# Patient Record
Sex: Female | Born: 1995 | Race: Black or African American | Hispanic: No | Marital: Single | Smoking: Never smoker
Health system: Southern US, Community
[De-identification: ages and names within clinical notes are randomized; demographics above are authoritative.]

## PROBLEM LIST (undated history)

## (undated) DIAGNOSIS — T148XXA Other injury of unspecified body region, initial encounter: Secondary | ICD-10-CM

## (undated) DIAGNOSIS — D6861 Antiphospholipid syndrome: Secondary | ICD-10-CM

## (undated) DIAGNOSIS — IMO0002 Reserved for concepts with insufficient information to code with codable children: Secondary | ICD-10-CM

## (undated) DIAGNOSIS — M329 Systemic lupus erythematosus, unspecified: Secondary | ICD-10-CM

## (undated) DIAGNOSIS — N289 Disorder of kidney and ureter, unspecified: Secondary | ICD-10-CM

## (undated) DIAGNOSIS — D735 Infarction of spleen: Secondary | ICD-10-CM

## (undated) DIAGNOSIS — Q2730 Arteriovenous malformation, site unspecified: Secondary | ICD-10-CM

---

## 2014-11-28 ENCOUNTER — Encounter (HOSPITAL_COMMUNITY): Payer: Self-pay

## 2014-11-28 ENCOUNTER — Emergency Department (HOSPITAL_COMMUNITY): Payer: Medicaid Other

## 2014-11-28 ENCOUNTER — Emergency Department (HOSPITAL_COMMUNITY)
Admission: EM | Admit: 2014-11-28 | Discharge: 2014-11-28 | Disposition: A | Discharge status: Home / Self Care | Payer: Medicaid Other | Attending: Emergency Medicine | Admitting: Emergency Medicine

## 2014-11-28 DIAGNOSIS — Z793 Long term (current) use of hormonal contraceptives: Secondary | ICD-10-CM | POA: Insufficient documentation

## 2014-11-28 DIAGNOSIS — Z3202 Encounter for pregnancy test, result negative: Secondary | ICD-10-CM | POA: Diagnosis not present

## 2014-11-28 DIAGNOSIS — N83201 Unspecified ovarian cyst, right side: Secondary | ICD-10-CM | POA: Insufficient documentation

## 2014-11-28 DIAGNOSIS — Z880 Allergy status to penicillin: Secondary | ICD-10-CM | POA: Diagnosis not present

## 2014-11-28 DIAGNOSIS — Z79899 Other long term (current) drug therapy: Secondary | ICD-10-CM | POA: Insufficient documentation

## 2014-11-28 DIAGNOSIS — R102 Pelvic and perineal pain: Secondary | ICD-10-CM

## 2014-11-28 DIAGNOSIS — N939 Abnormal uterine and vaginal bleeding, unspecified: Secondary | ICD-10-CM | POA: Diagnosis present

## 2014-11-28 DIAGNOSIS — N83209 Unspecified ovarian cyst, unspecified side: Secondary | ICD-10-CM

## 2014-11-28 LAB — URINALYSIS, ROUTINE W REFLEX MICROSCOPIC
Bilirubin Urine: NEGATIVE
Glucose, UA: NEGATIVE mg/dL
Ketones, ur: NEGATIVE mg/dL
Nitrite: NEGATIVE
Protein, ur: 100 mg/dL — AB
Specific Gravity, Urine: 1.018 (ref 1.005–1.030)
Urobilinogen, UA: 1 mg/dL (ref 0.0–1.0)
pH: 6 (ref 5.0–8.0)

## 2014-11-28 LAB — BASIC METABOLIC PANEL WITH GFR
Anion gap: 10 (ref 5–15)
BUN: 9 mg/dL (ref 6–20)
CO2: 26 mmol/L (ref 22–32)
Calcium: 9.4 mg/dL (ref 8.9–10.3)
Chloride: 103 mmol/L (ref 101–111)
Creatinine, Ser: 0.79 mg/dL (ref 0.44–1.00)
GFR calc Af Amer: >60 mL/min
GFR calc non Af Amer: >60 mL/min
Glucose, Bld: 88 mg/dL (ref 65–99)
Potassium: 3.5 mmol/L (ref 3.5–5.1)
Sodium: 139 mmol/L (ref 135–145)

## 2014-11-28 LAB — WET PREP, GENITAL
Trich, Wet Prep: NONE SEEN
Yeast Wet Prep HPF POC: NONE SEEN

## 2014-11-28 LAB — CBC WITH DIFFERENTIAL/PLATELET
Basophils Absolute: 0 K/uL (ref 0.0–0.1)
Basophils Relative: 1 %
Eosinophils Absolute: 0 K/uL (ref 0.0–0.7)
Eosinophils Relative: 1 %
HCT: 36.8 % (ref 36.0–46.0)
Hemoglobin: 12 g/dL (ref 12.0–15.0)
Lymphocytes Relative: 35 %
Lymphs Abs: 2.2 K/uL (ref 0.7–4.0)
MCH: 26.8 pg (ref 26.0–34.0)
MCHC: 32.6 g/dL (ref 30.0–36.0)
MCV: 82.3 fL (ref 78.0–100.0)
Monocytes Absolute: 0.5 K/uL (ref 0.1–1.0)
Monocytes Relative: 8 %
Neutro Abs: 3.6 K/uL (ref 1.7–7.7)
Neutrophils Relative %: 57 %
Platelets: 481 K/uL — ABNORMAL HIGH (ref 150–400)
RBC: 4.47 MIL/uL (ref 3.87–5.11)
RDW: 14.1 % (ref 11.5–15.5)
WBC: 6.4 K/uL (ref 4.0–10.5)

## 2014-11-28 LAB — I-STAT BETA HCG BLOOD, ED (MC, WL, AP ONLY): I-stat hCG, quantitative: <5 m[IU]/mL

## 2014-11-28 LAB — URINE MICROSCOPIC-ADD ON

## 2014-11-28 LAB — POC URINE PREG, ED: PREG TEST UR: NEGATIVE

## 2014-11-28 MED ORDER — OXYCODONE-ACETAMINOPHEN 5-325 MG PO TABS
1.0000 | ORAL_TABLET | Freq: Once | ORAL | Status: AC
  Administered 2014-11-28: 1 via ORAL
  Filled 2014-11-28: qty 1

## 2014-11-28 NOTE — ED Notes (Signed)
Pelvic cart at bedside. 

## 2014-11-28 NOTE — Discharge Instructions (Signed)
Pelvic Pain, Female °Female pelvic pain can be caused by many different things and start from a variety of places. Pelvic pain refers to pain that is located in the lower half of the abdomen and between your hips. The pain may occur over a short period of time (acute) or may be reoccurring (chronic). The cause of pelvic pain may be related to disorders affecting the female reproductive organs (gynecologic), but it may also be related to the bladder, kidney stones, an intestinal complication, or muscle or skeletal problems. Getting help right away for pelvic pain is important, especially if there has been severe, sharp, or a sudden onset of unusual pain. It is also important to get help right away because some types of pelvic pain can be life threatening.  °CAUSES  °Below are only some of the causes of pelvic pain. The causes of pelvic pain can be in one of several categories.  °· Gynecologic. °¨ Pelvic inflammatory disease. °¨ Sexually transmitted infection. °¨ Ovarian cyst or a twisted ovarian ligament (ovarian torsion). °¨ Uterine lining that grows outside the uterus (endometriosis). °¨ Fibroids, cysts, or tumors. °¨ Ovulation. °· Pregnancy. °¨ Pregnancy that occurs outside the uterus (ectopic pregnancy). °¨ Miscarriage. °¨ Labor. °¨ Abruption of the placenta or ruptured uterus. °· Infection. °¨ Uterine infection (endometritis). °¨ Bladder infection. °¨ Diverticulitis. °¨ Miscarriage related to a uterine infection (septic abortion). °· Bladder. °¨ Inflammation of the bladder (cystitis). °¨ Kidney stone(s). °· Gastrointestinal. °¨ Constipation. °¨ Diverticulitis. °· Neurologic. °¨ Trauma. °¨ Feeling pelvic pain because of mental or emotional causes (psychosomatic). °· Cancers of the bowel or pelvis. °EVALUATION  °Your caregiver will want to take a careful history of your concerns. This includes recent changes in your health, a careful gynecologic history of your periods (menses), and a sexual history. Obtaining  your family history and medical history is also important. Your caregiver may suggest a pelvic exam. A pelvic exam will help identify the location and severity of the pain. It also helps in the evaluation of which organ system may be involved. In order to identify the cause of the pelvic pain and be properly treated, your caregiver may order tests. These tests may include:  °· A pregnancy test. °· Pelvic ultrasonography. °· An X-ray exam of the abdomen. °· A urinalysis or evaluation of vaginal discharge. °· Blood tests. °HOME CARE INSTRUCTIONS  °· Only take over-the-counter or prescription medicines for pain, discomfort, or fever as directed by your caregiver.   °· Rest as directed by your caregiver.   °· Eat a balanced diet.   °· Drink enough fluids to make your urine clear or pale yellow, or as directed.   °· Avoid sexual intercourse if it causes pain.   °· Apply warm or cold compresses to the lower abdomen depending on which one helps the pain.   °· Avoid stressful situations.   °· Keep a journal of your pelvic pain. Write down when it started, where the pain is located, and if there are things that seem to be associated with the pain, such as food or your menstrual cycle. °· Follow up with your caregiver as directed.   °SEEK MEDICAL CARE IF: °· Your medicine does not help your pain. °· You have abnormal vaginal discharge. °SEEK IMMEDIATE MEDICAL CARE IF:  °· You have heavy bleeding from the vagina.   °· Your pelvic pain increases.   °· You feel light-headed or faint.   °· You have chills.   °· You have pain with urination or blood in your urine.   °· You have uncontrolled   diarrhea or vomiting.   You have a fever or persistent symptoms for more than 3 days.  You have a fever and your symptoms suddenly get worse.   You are being physically or sexually abused.   This information is not intended to replace advice given to you by your health care provider. Make sure you discuss any questions you have with  your health care provider.   Follow-up with OB/GYN as soon as possible for reevaluation of ruptured ovarian cyst. Will need follow-up ultrasound in 4-6 weeks. Return to the emergency department if you experience worsening of her symptoms, increased vaginal bleeding, increased pain with intercourse, fever, vomiting

## 2014-11-28 NOTE — ED Notes (Signed)
Patient transported to Ultrasound 

## 2014-11-28 NOTE — ED Notes (Signed)
She states she has had some abd. Cramping today and has been passing "a few golf-ball sized blood clots" vaginally today.  She states she has had some left-sided dyspareunia "for a while now".

## 2014-11-28 NOTE — ED Provider Notes (Signed)
CSN: [id number]     Arrival date & time 11/28/14  1700 History   First MD Initiated Contact with Patient 11/28/14 1834     Chief Complaint  Patient presents with  . Vaginal Bleeding     (Consider location/radiation/quality/duration/timing/severity/associated sxs/prior Treatment) HPI  Sheri Vasquez is a 19 y.o F with a pmhx of lupus who presents to the ED c/o abdominal pain, dyspareunia, and vaginal bleeding. Patient states that one week ago she began having pain with intercourse. Patient states that it feels that she is having sharp pains around her cervix. Patient states that today patient experienced similar pains while having intercourse however today these pains persisted for over an hour post coital. Also, Yesterday patient began passing large blood clots vaginally. This persisted up until today. She is using over 4 pads in a day which is unusual for the patient. Patient uses the NuvaRing as for contraception. LMP was October 15. Patient is sexually active with one partner. Denies fever, vomiting, diarrhea, melena, hematochezia, increased vaginal discharge, dysuria.  No past medical history on file. No past surgical history on file. No family history on file. Social History  Substance Use Topics  . Smoking status: Never Smoker   . Smokeless tobacco: Not on file  . Alcohol Use: No   OB History    No data available     Review of Systems  All other systems reviewed and are negative.     Allergies  Penicillins  Home Medications   Prior to Admission medications   Medication Sig Start Date End Date Taking? Authorizing Provider  cholecalciferol (VITAMIN D) 1000 UNITS tablet Take 1,000 Units by mouth daily.   Yes Historical Provider, MD  etonogestrel-ethinyl estradiol (NUVARING) 0.12-0.015 MG/24HR vaginal ring Place 1 each vaginally every 28 (twenty-eight) days. Insert vaginally and leave in place for 3 consecutive weeks, then remove for 1 week.   Yes Historical Provider, MD   ferrous sulfate 325 (65 FE) MG tablet Take 325 mg by mouth daily with breakfast.   Yes Historical Provider, MD  hydroxychloroquine (PLAQUENIL) 200 MG tablet Take 200 mg by mouth daily.   Yes Historical Provider, MD   BP 170/106 mmHg  Pulse 87  Temp(Src) 98.3 F (36.8 C) (Oral)  Resp 14  Ht 5\' 9"  (1.753 m)  Wt 185 lb (83.915 kg)  BMI 27.31 kg/m2  SpO2 100%  LMP 11/03/2014 (Approximate) Physical Exam  Constitutional: She is oriented to person, place, and time. She appears well-developed and well-nourished. No distress.  HENT:  Head: Normocephalic and atraumatic.  Mouth/Throat: No oropharyngeal exudate.  Eyes: Conjunctivae and EOM are normal. Pupils are equal, round, and reactive to light. Right eye exhibits no discharge. Left eye exhibits no discharge. No scleral icterus.  Cardiovascular: Normal rate, regular rhythm, normal heart sounds and intact distal pulses.  Exam reveals no gallop and no friction rub.   No murmur heard. Pulmonary/Chest: Effort normal and breath sounds normal. No respiratory distress. She has no wheezes. She has no rales. She exhibits no tenderness.  Abdominal: Soft. She exhibits no distension. There is tenderness ( suprapubic TTP). There is no guarding.  Genitourinary:  No CMT. Copious blood in vaginal vault. Cervical os closed. No external genital lesions. Right sided Adnexal TTP on bimanual exam.  No ovaries palpated.  Musculoskeletal: Normal range of motion. She exhibits no edema.  Neurological: She is alert and oriented to person, place, and time.  Skin: Skin is warm and dry. No rash noted. She is not diaphoretic. No  erythema. No pallor.  Psychiatric: She has a normal mood and affect. Her behavior is normal.  Nursing note and vitals reviewed.   ED Course  Procedures (including critical care time) Labs Review Labs Reviewed  WET PREP, GENITAL - Abnormal; Notable for the following:    Clue Cells Wet Prep HPF POC FEW (*)    WBC, Wet Prep HPF POC FEW (*)     All other components within normal limits  CBC WITH DIFFERENTIAL/PLATELET - Abnormal; Notable for the following:    Platelets 481 (*)    All other components within normal limits  URINALYSIS, ROUTINE W REFLEX MICROSCOPIC (NOT AT [facility]) - Abnormal; Notable for the following:    Color, Urine RED (*)    APPearance TURBID (*)    Hgb urine dipstick LARGE (*)    Protein, ur 100 (*)    Leukocytes, UA SMALL (*)    All other components within normal limits  BASIC METABOLIC PANEL  HIV ANTIBODY (ROUTINE TESTING)  URINE MICROSCOPIC-ADD ON  POC URINE PREG, ED  I-STAT BETA HCG BLOOD, ED ([facility], [facility], [facility] ONLY)  GC/CHLAMYDIA PROBE AMP ([place]) NOT AT [facility]    Imaging Review US Transvaginal Non-ob  11/28/2014  CLINICAL DATA:  Pelvic cramping and heavy menstrual period. Negative pregnancy test. EXAM: TRANSABDOMINAL AND TRANSVAGINAL ULTRASOUND OF PELVIS DOPPLER ULTRASOUND OF OVARIES TECHNIQUE: Both transabdominal and transvaginal ultrasound examinations of the pelvis were performed. Transabdominal technique was performed for global imaging of the pelvis including uterus, ovaries, adnexal regions, and pelvic cul-de-sac. It was necessary to proceed with endovaginal exam following the transabdominal exam to visualize the adnexa. Color and duplex Doppler ultrasound was utilized to evaluate blood flow to the ovaries. COMPARISON:  None. FINDINGS: Uterus Measurements: 8.8 x 4.4 x 5.5 cm. No fibroids or other mass visualized. Endometrium Thickness: 0.6 cm.  No focal abnormality visualized. Right ovary Measurements: 4.6 x 2.9 x 4.4 cm. Normal appearance/no adnexal mass. Left ovary Measurements: 3.5 x 2.8 x 2.3 cm. There is a cystic lesion in the right ovary measuring 3.1 x 1.4 x 3.1 cm. The cyst has irregular echogenic material within it which appears adherent to portions of the walls of the cyst. The walls of the cysts have straightened to slightly irregular borders. Pulsed Doppler evaluation of both ovaries demonstrates  normal low-resistance arterial and venous waveforms. Other findings There is a small volume of free pelvic fluid with internal echoes within it. IMPRESSION: Negative for ovarian torsion. Findings most compatible with an involuting hemorrhagic right ovarian cyst with an associated small volume of complex free pelvic fluid. Repeat ultrasound in 4-6 weeks to ensure resolution is recommended. Electronically Signed   By: Drusilla Kanner M.D.   On: 11/28/2014 21:41   US Pelvis Complete  11/28/2014  CLINICAL DATA:  Pelvic cramping and heavy menstrual period. Negative pregnancy test. EXAM: TRANSABDOMINAL AND TRANSVAGINAL ULTRASOUND OF PELVIS DOPPLER ULTRASOUND OF OVARIES TECHNIQUE: Both transabdominal and transvaginal ultrasound examinations of the pelvis were performed. Transabdominal technique was performed for global imaging of the pelvis including uterus, ovaries, adnexal regions, and pelvic cul-de-sac. It was necessary to proceed with endovaginal exam following the transabdominal exam to visualize the adnexa. Color and duplex Doppler ultrasound was utilized to evaluate blood flow to the ovaries. COMPARISON:  None. FINDINGS: Uterus Measurements: 8.8 x 4.4 x 5.5 cm. No fibroids or other mass visualized. Endometrium Thickness: 0.6 cm.  No focal abnormality visualized. Right ovary Measurements: 4.6 x 2.9 x 4.4 cm. Normal appearance/no adnexal mass. Left ovary Measurements:  3.5 x 2.8 x 2.3 cm. There is a cystic lesion in the right ovary measuring 3.1 x 1.4 x 3.1 cm. The cyst has irregular echogenic material within it which appears adherent to portions of the walls of the cyst. The walls of the cysts have straightened to slightly irregular borders. Pulsed Doppler evaluation of both ovaries demonstrates normal low-resistance arterial and venous waveforms. Other findings There is a small volume of free pelvic fluid with internal echoes within it. IMPRESSION: Negative for ovarian torsion. Findings most compatible with an  involuting hemorrhagic right ovarian cyst with an associated small volume of complex free pelvic fluid. Repeat ultrasound in 4-6 weeks to ensure resolution is recommended. Electronically Signed   By: Drusilla Kanner M.D.   On: 11/28/2014 21:41   [place] Art/ven Flow Abd Pelv Doppler  11/28/2014  CLINICAL DATA:  Pelvic cramping and heavy menstrual period. Negative pregnancy test. EXAM: TRANSABDOMINAL AND TRANSVAGINAL ULTRASOUND OF PELVIS DOPPLER ULTRASOUND OF OVARIES TECHNIQUE: Both transabdominal and transvaginal ultrasound examinations of the pelvis were performed. Transabdominal technique was performed for global imaging of the pelvis including uterus, ovaries, adnexal regions, and pelvic cul-de-sac. It was necessary to proceed with endovaginal exam following the transabdominal exam to visualize the adnexa. Color and duplex Doppler ultrasound was utilized to evaluate blood flow to the ovaries. COMPARISON:  None. FINDINGS: Uterus Measurements: 8.8 x 4.4 x 5.5 cm. No fibroids or other mass visualized. Endometrium Thickness: 0.6 cm.  No focal abnormality visualized. Right ovary Measurements: 4.6 x 2.9 x 4.4 cm. Normal appearance/no adnexal mass. Left ovary Measurements: 3.5 x 2.8 x 2.3 cm. There is a cystic lesion in the right ovary measuring 3.1 x 1.4 x 3.1 cm. The cyst has irregular echogenic material within it which appears adherent to portions of the walls of the cyst. The walls of the cysts have straightened to slightly irregular borders. Pulsed Doppler evaluation of both ovaries demonstrates normal low-resistance arterial and venous waveforms. Other findings There is a small volume of free pelvic fluid with internal echoes within it. IMPRESSION: Negative for ovarian torsion. Findings most compatible with an involuting hemorrhagic right ovarian cyst with an associated small volume of complex free pelvic fluid. Repeat ultrasound in 4-6 weeks to ensure resolution is recommended. Electronically Signed   By: Drusilla Kanner M.D.   On: 11/28/2014 21:41   I have personally reviewed and evaluated these images and lab results as part of my medical decision-making.   EKG Interpretation None      MDM   Final diagnoses:  Ruptured ovarian cyst    19 y.o otherwise healthy F pt presents with increased vaginal bleeding, dyspareunia and abdominal pain x 1 week. Pt has been having sharp pains during intercourse for 1 week. Pt had same pains again today during intercourse however, the pain lasted for several hours postcoital. She also reports passing multiple large blood clots over the last 2 days which is unusual for pt. Pt also reports a "bulge" over her RLQ. Unknown pregnancy status. No fever/vomiting. Concern for ectopic vs spontaneous abortion vs ovarian torsion.   Pregnancy test negative.  Still concern for torsion will order transvaginal US.  Pelvic exam performed: cervical os closed. Large blood in vaginal vault. Significant adnexal tenderness on bimanual exam. All labs wnl.   US reveals an involuting hemorrhagic right ovarian cyst with an associated small volume of complex free pelvic fluid. This is consistent with pts symptoms.  Recommend follow up with OBGYN, will give referral today. Pt will need follow  up [place] in 4-6 weeks to ensure resolution. Discussed treatment plan with pt who is agreeable. Return precautions outlined in patient discharge instructions.       Lester Kinsman [place], PA-C 11/29/14 1523  Pricilla Loveless, MD 12/02/14 [phone]

## 2014-11-29 LAB — HIV ANTIBODY (ROUTINE TESTING W REFLEX): HIV Screen 4th Generation wRfx: NONREACTIVE

## 2014-11-30 LAB — GC/CHLAMYDIA PROBE AMP (~~LOC~~) NOT AT ARMC
Chlamydia: NEGATIVE
Neisseria Gonorrhea: NEGATIVE

## 2015-03-29 ENCOUNTER — Encounter (HOSPITAL_COMMUNITY): Payer: Self-pay | Admitting: Emergency Medicine

## 2015-03-29 ENCOUNTER — Emergency Department (HOSPITAL_COMMUNITY)
Admission: EM | Admit: 2015-03-29 | Discharge: 2015-03-29 | Disposition: A | Discharge status: Home / Self Care | Payer: Medicaid Other | Attending: Emergency Medicine | Admitting: Emergency Medicine

## 2015-03-29 ENCOUNTER — Emergency Department (HOSPITAL_COMMUNITY): Payer: Medicaid Other

## 2015-03-29 DIAGNOSIS — Z3202 Encounter for pregnancy test, result negative: Secondary | ICD-10-CM | POA: Diagnosis not present

## 2015-03-29 DIAGNOSIS — Z8739 Personal history of other diseases of the musculoskeletal system and connective tissue: Secondary | ICD-10-CM | POA: Diagnosis not present

## 2015-03-29 DIAGNOSIS — Z862 Personal history of diseases of the blood and blood-forming organs and certain disorders involving the immune mechanism: Secondary | ICD-10-CM | POA: Insufficient documentation

## 2015-03-29 DIAGNOSIS — R109 Unspecified abdominal pain: Secondary | ICD-10-CM | POA: Insufficient documentation

## 2015-03-29 DIAGNOSIS — R110 Nausea: Secondary | ICD-10-CM | POA: Insufficient documentation

## 2015-03-29 DIAGNOSIS — R200 Anesthesia of skin: Secondary | ICD-10-CM | POA: Insufficient documentation

## 2015-03-29 DIAGNOSIS — R1032 Left lower quadrant pain: Secondary | ICD-10-CM | POA: Diagnosis present

## 2015-03-29 DIAGNOSIS — Z79899 Other long term (current) drug therapy: Secondary | ICD-10-CM | POA: Diagnosis not present

## 2015-03-29 DIAGNOSIS — Z87448 Personal history of other diseases of urinary system: Secondary | ICD-10-CM | POA: Insufficient documentation

## 2015-03-29 DIAGNOSIS — Q2730 Arteriovenous malformation, site unspecified: Secondary | ICD-10-CM | POA: Diagnosis not present

## 2015-03-29 DIAGNOSIS — Z880 Allergy status to penicillin: Secondary | ICD-10-CM | POA: Diagnosis not present

## 2015-03-29 HISTORY — DX: Antiphospholipid syndrome: D68.61

## 2015-03-29 HISTORY — DX: Other injury of unspecified body region, initial encounter: T14.8XXA

## 2015-03-29 HISTORY — DX: Systemic lupus erythematosus, unspecified: M32.9

## 2015-03-29 HISTORY — DX: Reserved for concepts with insufficient information to code with codable children: IMO0002

## 2015-03-29 HISTORY — DX: Arteriovenous malformation, site unspecified: Q27.30

## 2015-03-29 HISTORY — DX: Infarction of spleen: D73.5

## 2015-03-29 HISTORY — DX: Disorder of kidney and ureter, unspecified: N28.9

## 2015-03-29 LAB — CBC
HCT: 37 % (ref 36.0–46.0)
Hemoglobin: 11.8 g/dL — ABNORMAL LOW (ref 12.0–15.0)
MCH: 25.6 pg — ABNORMAL LOW (ref 26.0–34.0)
MCHC: 31.9 g/dL (ref 30.0–36.0)
MCV: 80.3 fL (ref 78.0–100.0)
PLATELETS: 411 10*3/uL — AB (ref 150–400)
RBC: 4.61 MIL/uL (ref 3.87–5.11)
RDW: 14.5 % (ref 11.5–15.5)
WBC: 6.7 10*3/uL (ref 4.0–10.5)

## 2015-03-29 LAB — COMPREHENSIVE METABOLIC PANEL
ALT: 13 U/L — ABNORMAL LOW (ref 14–54)
ANION GAP: 10 (ref 5–15)
AST: 14 U/L — AB (ref 15–41)
Albumin: 4.4 g/dL (ref 3.5–5.0)
Alkaline Phosphatase: 66 U/L (ref 38–126)
BILIRUBIN TOTAL: 0.9 mg/dL (ref 0.3–1.2)
BUN: 11 mg/dL (ref 6–20)
CALCIUM: 9.2 mg/dL (ref 8.9–10.3)
CO2: 26 mmol/L (ref 22–32)
CREATININE: 0.72 mg/dL (ref 0.44–1.00)
Chloride: 102 mmol/L (ref 101–111)
GLUCOSE: 90 mg/dL (ref 65–99)
POTASSIUM: 3.4 mmol/L — AB (ref 3.5–5.1)
Sodium: 138 mmol/L (ref 135–145)
Total Protein: 8.2 g/dL — ABNORMAL HIGH (ref 6.5–8.1)

## 2015-03-29 LAB — URINALYSIS, ROUTINE W REFLEX MICROSCOPIC
Bilirubin Urine: NEGATIVE
GLUCOSE, UA: NEGATIVE mg/dL
HGB URINE DIPSTICK: NEGATIVE
KETONES UR: NEGATIVE mg/dL
LEUKOCYTES UA: NEGATIVE
Nitrite: NEGATIVE
PROTEIN: NEGATIVE mg/dL
Specific Gravity, Urine: 1.014 (ref 1.005–1.030)
pH: 6.5 (ref 5.0–8.0)

## 2015-03-29 LAB — LIPASE, BLOOD: LIPASE: 26 U/L (ref 11–51)

## 2015-03-29 LAB — PROTIME-INR
INR: 1.11 (ref 0.00–1.49)
PROTHROMBIN TIME: 14.5 s (ref 11.6–15.2)

## 2015-03-29 LAB — PREGNANCY, URINE: Preg Test, Ur: NEGATIVE

## 2015-03-29 MED ORDER — IOHEXOL 300 MG/ML  SOLN
100.0000 mL | Freq: Once | INTRAMUSCULAR | Status: AC | PRN
  Administered 2015-03-29: 100 mL via INTRAVENOUS

## 2015-03-29 MED ORDER — ONDANSETRON HCL 4 MG/2ML IJ SOLN
4.0000 mg | Freq: Once | INTRAMUSCULAR | Status: AC
  Administered 2015-03-29: 4 mg via INTRAVENOUS
  Filled 2015-03-29: qty 2

## 2015-03-29 MED ORDER — HYDROMORPHONE HCL 1 MG/ML IJ SOLN
1.0000 mg | Freq: Once | INTRAMUSCULAR | Status: AC
Start: 2015-03-29 — End: 2015-03-29
  Administered 2015-03-29: 1 mg via INTRAVENOUS
  Filled 2015-03-29: qty 1

## 2015-03-29 NOTE — ED Notes (Signed)
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  Pt states that it was in abd wall but then moved to that muscle. Pt states that she has been having lt sided lower abd pain since yesterday around noon.  Numbness in top of lt leg started about 2 hrs ago.  Pt is concerned about this because this is the same sx she had with the hematoma.  Pt states she has had 30 surgeries related to that hematoma and normally goes to [facility] for her care.

## 2015-03-29 NOTE — Discharge Instructions (Signed)

## 2015-03-29 NOTE — ED Provider Notes (Signed)
CSN: [id number]     Arrival date & time 03/29/15  0023 History   First MD Initiated Contact with Patient 03/29/15 0122     Chief Complaint  Patient presents with  . Abdominal Pain  . Numbness     (Consider location/radiation/quality/duration/timing/severity/associated sxs/prior Treatment) Patient is a 20 y.o. female presenting with abdominal pain. The history is provided by the patient. No language interpreter was used.  Abdominal Pain Pain location:  LLQ Pain quality: aching   Pain radiates to:  Does not radiate Pain severity:  Severe Onset quality:  Gradual Duration:  1 day Timing:  Constant Progression:  Worsening Chronicity:  New Context: not alcohol use   Relieved by:  Nothing Worsened by:  Nothing tried Ineffective treatments:  None tried Associated symptoms: nausea   Pt has a history of av malformation.   Pt had an area bleed into her abdominal wall which caused to lose a lot of blood.  Pt reports she had multiple surgeries.  Pt is [facility] at [facility].  Pt reports she is having pain on her left side similar to the pain she had on her right side  Past Medical History  Diagnosis Date  . Splenic infarct   . Lupus (HCC)   . Antiphospholipid syndrome (HCC)   . AVM (arteriovenous malformation)   . Hematoma   . Renal disorder    History reviewed. No pertinent past surgical history. History reviewed. No pertinent family history. Social History  Substance Use Topics  . Smoking status: Never Smoker   . Smokeless tobacco: None  . Alcohol Use: No   OB History    No data available     Review of Systems  Gastrointestinal: Positive for nausea and abdominal pain.  All other systems reviewed and are negative.     Allergies  Penicillins  Home Medications   Prior to Admission medications   Medication Sig Start Date End Date Taking? Authorizing Provider  cholecalciferol (VITAMIN D) 1000 UNITS tablet Take 1,000 Units by mouth daily.   Yes Historical  Provider, MD  ferrous sulfate 325 (65 FE) MG tablet Take 325 mg by mouth daily with breakfast.    Historical Provider, MD  hydroxychloroquine (PLAQUENIL) 200 MG tablet Take 200 mg by mouth daily.    Historical Provider, MD   BP 141/106 mmHg  Pulse 94  Temp(Src) 98.5 F (36.9 C) (Oral)  Resp 16  SpO2 100%  LMP 03/16/2015 (Exact Date) Physical Exam  Constitutional: She is oriented to person, place, and time. She appears well-developed and well-nourished.  HENT:  Head: Normocephalic and atraumatic.  Eyes: Conjunctivae and EOM are normal. Pupils are equal, round, and reactive to light.  Neck: Normal range of motion.  Cardiovascular: Normal rate and normal heart sounds.   Pulmonary/Chest: Effort normal.  Abdominal: Soft. She exhibits no distension. There is tenderness.  Musculoskeletal: Normal range of motion.  Neurological: She is alert and oriented to person, place, and time.  Skin: Skin is warm.  Psychiatric: She has a normal mood and affect.  Nursing note and vitals reviewed.   ED Course  Procedures (including critical care time) Labs Review Labs Reviewed  COMPREHENSIVE METABOLIC PANEL - Abnormal; Notable for the following:    Potassium 3.4 (*)    Total Protein 8.2 (*)    AST 14 (*)    ALT 13 (*)    All other components within normal limits  CBC - Abnormal; Notable for the following:    Hemoglobin 11.8 (*)  MCH 25.6 (*)    Platelets 411 (*)    All other components within normal limits  URINALYSIS, ROUTINE W REFLEX MICROSCOPIC (NOT AT [facility]) - Abnormal; Notable for the following:    APPearance CLOUDY (*)    All other components within normal limits  LIPASE, BLOOD  PROTIME-INR    Imaging Review Ct Abdomen Pelvis W Contrast  03/29/2015  CLINICAL DATA:  Acute onset of left lower quadrant abdominal pain and left leg numbness. Current history of arteriovenous malformation within the abdomen. Initial encounter. EXAM: CT ABDOMEN AND PELVIS WITH CONTRAST TECHNIQUE:  Multidetector CT imaging of the abdomen and pelvis was performed using the standard protocol following bolus administration of intravenous contrast. CONTRAST:  100mL OMNIPAQUE IOHEXOL 300 MG/ML  SOLN COMPARISON:  Pelvic ultrasound performed 11/28/2014 FINDINGS: The visualized lung bases are clear. The liver and spleen are unremarkable in appearance. The gallbladder is within normal limits. The pancreas and adrenal glands are unremarkable. A 1.5 cm cyst is noted at the upper pole of the right kidney. There is no evidence of hydronephrosis. No renal or ureteral stones are seen. No perinephric stranding is appreciated. No free fluid is identified. The small bowel is unremarkable in appearance. The stomach is within normal limits. No acute vascular abnormalities are seen. The appendix is not definitely characterized; there is no evidence of appendicitis. The colon is partially filled with stool and is unremarkable in appearance. The bladder is mildly distended and grossly unremarkable. The uterus is within normal limits. The ovaries are relatively symmetric. No suspicious adnexal masses are seen. No inguinal lymphadenopathy is seen. Postoperative change is noted along the abdominal wall at the right lower quadrant. No acute osseous abnormalities are identified. IMPRESSION: 1. No acute abnormality seen within the abdomen or pelvis. 2. Small right renal cyst noted. Electronically Signed   By: Roanna RaiderJeffery  Chang M.D.   On: 03/29/2015 03:43   I have personally reviewed and evaluated these images and lab results as part of my medical decision-making.   EKG Interpretation None      MDM  Pt given dilaudid and zofran for discomfort.   Ct scan is normal.  No hematoma's.   Pt [facility].     Final diagnoses:  Abdominal wall pain    An After Visit Summary was printed and given to the patient.    Lonia SkinnerLeslie K VineyardSofia, PA-C 03/29/15 [phone]  Raeford RazorStephen Kohut, MD 03/29/15  [phone]

## 2015-11-30 ENCOUNTER — Emergency Department (HOSPITAL_COMMUNITY)
Admission: EM | Admit: 2015-11-30 | Discharge: 2015-12-01 | Disposition: A | Discharge status: Home / Self Care | Payer: Medicaid Other | Attending: Emergency Medicine | Admitting: Emergency Medicine

## 2015-11-30 ENCOUNTER — Encounter (HOSPITAL_COMMUNITY): Payer: Self-pay | Admitting: Emergency Medicine

## 2015-11-30 DIAGNOSIS — E876 Hypokalemia: Secondary | ICD-10-CM | POA: Diagnosis not present

## 2015-11-30 DIAGNOSIS — F4323 Adjustment disorder with mixed anxiety and depressed mood: Secondary | ICD-10-CM

## 2015-11-30 DIAGNOSIS — Z7982 Long term (current) use of aspirin: Secondary | ICD-10-CM | POA: Diagnosis not present

## 2015-11-30 DIAGNOSIS — Z79899 Other long term (current) drug therapy: Secondary | ICD-10-CM | POA: Insufficient documentation

## 2015-11-30 DIAGNOSIS — D649 Anemia, unspecified: Secondary | ICD-10-CM | POA: Diagnosis not present

## 2015-11-30 DIAGNOSIS — Z8249 Family history of ischemic heart disease and other diseases of the circulatory system: Secondary | ICD-10-CM | POA: Diagnosis not present

## 2015-11-30 DIAGNOSIS — Z91013 Allergy to seafood: Secondary | ICD-10-CM | POA: Diagnosis not present

## 2015-11-30 DIAGNOSIS — R45851 Suicidal ideations: Secondary | ICD-10-CM

## 2015-11-30 DIAGNOSIS — Z880 Allergy status to penicillin: Secondary | ICD-10-CM | POA: Diagnosis not present

## 2015-11-30 LAB — RAPID URINE DRUG SCREEN, HOSP PERFORMED
Amphetamines: NOT DETECTED
BARBITURATES: NOT DETECTED
Benzodiazepines: NOT DETECTED
Cocaine: NOT DETECTED
Opiates: NOT DETECTED
TETRAHYDROCANNABINOL: POSITIVE — AB

## 2015-11-30 LAB — COMPREHENSIVE METABOLIC PANEL
ALBUMIN: 4 g/dL (ref 3.5–5.0)
ALK PHOS: 46 U/L (ref 38–126)
ALT: 10 U/L — ABNORMAL LOW (ref 14–54)
ANION GAP: 8 (ref 5–15)
AST: 12 U/L — AB (ref 15–41)
BILIRUBIN TOTAL: 0.5 mg/dL (ref 0.3–1.2)
BUN: 12 mg/dL (ref 6–20)
CALCIUM: 8.6 mg/dL — AB (ref 8.9–10.3)
CO2: 23 mmol/L (ref 22–32)
Chloride: 108 mmol/L (ref 101–111)
Creatinine, Ser: 0.85 mg/dL (ref 0.44–1.00)
GFR calc Af Amer: >60 mL/min (ref >60)
GFR calc non Af Amer: >60 mL/min (ref >60)
GLUCOSE: 82 mg/dL (ref 65–99)
POTASSIUM: 3.1 mmol/L — AB (ref 3.5–5.1)
SODIUM: 139 mmol/L (ref 135–145)
Total Protein: 7.5 g/dL (ref 6.5–8.1)

## 2015-11-30 LAB — CBC WITH DIFFERENTIAL/PLATELET
Basophils Absolute: 0 10*3/uL (ref 0.0–0.1)
Basophils Relative: 1 %
Eosinophils Absolute: 0 10*3/uL (ref 0.0–0.7)
Eosinophils Relative: 0 %
HEMATOCRIT: 30.3 % — AB (ref 36.0–46.0)
HEMOGLOBIN: 9.6 g/dL — AB (ref 12.0–15.0)
LYMPHS ABS: 1.9 10*3/uL (ref 0.7–4.0)
Lymphocytes Relative: 27 %
MCH: 23.3 pg — AB (ref 26.0–34.0)
MCHC: 31.7 g/dL (ref 30.0–36.0)
MCV: 73.5 fL — ABNORMAL LOW (ref 78.0–100.0)
MONO ABS: 0.9 10*3/uL (ref 0.1–1.0)
MONOS PCT: 12 %
NEUTROS ABS: 4.4 10*3/uL (ref 1.7–7.7)
NEUTROS PCT: 60 %
Platelets: 303 10*3/uL (ref 150–400)
RBC: 4.12 MIL/uL (ref 3.87–5.11)
RDW: 17 % — AB (ref 11.5–15.5)
WBC: 7.3 10*3/uL (ref 4.0–10.5)

## 2015-11-30 LAB — I-STAT BETA HCG BLOOD, ED (MC, WL, AP ONLY)

## 2015-11-30 LAB — ETHANOL: Alcohol, Ethyl (B): <5 mg/dL (ref <5)

## 2015-11-30 LAB — RAPID STREP SCREEN (MED CTR MEBANE ONLY): STREPTOCOCCUS, GROUP A SCREEN (DIRECT): NEGATIVE

## 2015-11-30 MED ORDER — ONDANSETRON 4 MG PO TBDP
4.0000 mg | ORAL_TABLET | Freq: Once | ORAL | Status: AC
  Administered 2015-11-30: 4 mg via ORAL
  Filled 2015-11-30: qty 1

## 2015-11-30 MED ORDER — IBUPROFEN 200 MG PO TABS
600.0000 mg | ORAL_TABLET | Freq: Three times a day (TID) | ORAL | Status: DC | PRN
  Administered 2015-11-30: 600 mg via ORAL
  Filled 2015-11-30: qty 3

## 2015-11-30 MED ORDER — ACETAMINOPHEN 325 MG PO TABS
650.0000 mg | ORAL_TABLET | Freq: Once | ORAL | Status: AC
  Administered 2015-11-30: 650 mg via ORAL
  Filled 2015-11-30: qty 2

## 2015-11-30 MED ORDER — ACETAMINOPHEN 325 MG PO TABS: 650.0000 mg | ORAL_TABLET | ORAL | Status: DC | PRN

## 2015-11-30 MED ORDER — FERROUS SULFATE 325 (65 FE) MG PO TABS
325.0000 mg | ORAL_TABLET | Freq: Every day | ORAL | Status: DC
  Administered 2015-12-01: 325 mg via ORAL
  Filled 2015-11-30: qty 1

## 2015-11-30 MED ORDER — HYDROXYZINE HCL 25 MG PO TABS
25.0000 mg | ORAL_TABLET | Freq: Three times a day (TID) | ORAL | Status: DC | PRN
  Administered 2015-12-01: 25 mg via ORAL
  Filled 2015-11-30: qty 1

## 2015-11-30 MED ORDER — NIFEDIPINE ER 60 MG PO TB24: 60.0000 mg | ORAL_TABLET | Freq: Two times a day (BID) | ORAL | Status: DC

## 2015-11-30 MED ORDER — HYDROXYCHLOROQUINE SULFATE 200 MG PO TABS
200.0000 mg | ORAL_TABLET | Freq: Every day | ORAL | Status: DC
  Administered 2015-11-30 – 2015-12-01 (×2): 200 mg via ORAL
  Filled 2015-11-30 (×2): qty 1

## 2015-11-30 MED ORDER — POTASSIUM CHLORIDE CRYS ER 20 MEQ PO TBCR
40.0000 meq | EXTENDED_RELEASE_TABLET | Freq: Once | ORAL | Status: AC
  Administered 2015-11-30: 40 meq via ORAL
  Filled 2015-11-30: qty 2

## 2015-11-30 MED ORDER — VITAMIN D (ERGOCALCIFEROL) 1.25 MG (50000 UNIT) PO CAPS
50000.0000 [IU] | ORAL_CAPSULE | ORAL | Status: DC
  Administered 2015-11-30: 50000 [IU] via ORAL
  Filled 2015-11-30: qty 1

## 2015-11-30 MED ORDER — NIFEDIPINE ER 60 MG PO TB24
60.0000 mg | ORAL_TABLET | Freq: Two times a day (BID) | ORAL | Status: DC
  Administered 2015-11-30 – 2015-12-01 (×2): 60 mg via ORAL
  Filled 2015-11-30 (×2): qty 1

## 2015-11-30 NOTE — ED Notes (Signed)
Pt oriented to room and unit.  Mom is at bedside.  Pt is pleasant and cooperative.  15 minute checks and video monitoring in place.

## 2015-11-30 NOTE — ED Notes (Addendum)
Patient is A&O x4. Mother is at the bedside with patient. Patient presents mildly anxious and sad. Patient reports having a headache 7/10 at the base of her skull to neck. PRN ibuprofen 600 mg given and snack given. Fluids encouraged. Will reassess. Patient denies SI/HI and AVH. Patient on Q15 minute safety checks.

## 2015-11-30 NOTE — ED Notes (Signed)
ED Provider at bedside. 

## 2015-11-30 NOTE — ED Notes (Signed)
Patient began to vomit. MD notified and order for Zofran 4mg  obtained. Patient medicated and provided fluids and broth. Patient comforted and encouragement given.

## 2015-11-30 NOTE — ED Notes (Addendum)
Patient reports wanting to leave the hospital. Mother was at the bedside, wants to take patient home. Dr. Lynelle DoctorKnapp notified. TTS worker [employer] notified. Pending psych consult.

## 2015-11-30 NOTE — ED Provider Notes (Addendum)
[facility]-EMERGENCY DEPT Provider Note   CSN: [id number] Arrival date & time: 11/30/15  1548     History   Chief Complaint Chief Complaint  Patient presents with  . Suicidal    HPI Sheri Vasquez is a 20 y.o. female.  HPI Patient presents to the emergency room with complaints of suicidal ideation. The patient has had a lot of stress this past year. She had a miscarriage. Her grandmother died. She had to classmates that were also killed. Patient has been feeling depressed. Today she was going to cut her wrists and her roommate found her. She was sent into the emergency room for further evaluation.  Patient also complains of a headache. She denies any fevers or chills but she also has a sore throat and she thought she saw some white spots in the back of her throat Past Medical History:  Diagnosis Date  . Antiphospholipid syndrome (HCC)   . AVM (arteriovenous malformation)   . Hematoma   . Lupus   . Renal disorder   . Splenic infarct     There are no active problems to display for this patient.   History reviewed. No pertinent surgical history.  OB History    No data available       Home Medications    Prior to Admission medications   Medication Sig Start Date End Date Taking? Authorizing Provider  aspirin EC 81 MG tablet Take 81 mg by mouth daily. 06/10/15  Yes Historical Provider, MD  ferrous sulfate 325 (65 FE) MG tablet Take 325 mg by mouth daily with breakfast.   Yes Historical Provider, MD  hydroxychloroquine (PLAQUENIL) 200 MG tablet Take 200 mg by mouth daily.   Yes Historical Provider, MD  NIFEdipine (PROCARDIA-XL/ADALAT-CC/NIFEDICAL-XL) 30 MG 24 hr tablet Take 60 mg by mouth 2 (two) times daily. 09/15/15  Yes Historical Provider, MD  Vitamin D, Ergocalciferol, (DRISDOL) 50000 units CAPS capsule Take 50,000 Units by mouth every 7 (seven) days. 06/15/15  Yes Historical Provider, MD    Family History Family History  Problem Relation Age of Onset  . Hypertension  Mother   . Hypertension Father     Social History Social History  Substance Use Topics  . Smoking status: Never Smoker  . Smokeless tobacco: Never Used  . Alcohol use No     Allergies   Penicillins and Shellfish-derived products   Review of Systems Review of Systems  All other systems reviewed and are negative.    Physical Exam Updated Vital Signs BP 143/93 (BP Location: Right Arm)   Pulse 67   Temp 98.6 F (37 C) (Oral)   Resp 18   LMP 11/28/2015   SpO2 100%   Physical Exam  Constitutional: She appears well-developed and well-nourished. No distress.  HENT:  Head: Normocephalic and atraumatic.  Right Ear: External ear normal.  Left Ear: External ear normal.  Mouth/Throat: No oropharyngeal exudate.  Eyes: Conjunctivae are normal. Right eye exhibits no discharge. Left eye exhibits no discharge. No scleral icterus.  Neck: Neck supple. No tracheal deviation present.  Cardiovascular: Normal rate, regular rhythm and intact distal pulses.   Pulmonary/Chest: Effort normal and breath sounds normal. No stridor. No respiratory distress. She has no wheezes. She has no rales.  Abdominal: Soft. Bowel sounds are normal. She exhibits no distension. There is no tenderness. There is no rebound and no guarding.  Musculoskeletal: She exhibits no edema or tenderness.  Neurological: She is alert. She has normal strength. No cranial nerve deficit (no facial droop,  extraocular movements intact, no slurred speech) or sensory deficit. She exhibits normal muscle tone. She displays no seizure activity. Coordination normal.  Skin: Skin is warm and dry. No rash noted.  Psychiatric: She has a normal mood and affect.  Nursing note and vitals reviewed.    ED Treatments / Results  Labs (all labs ordered are listed, but only abnormal results are displayed) Labs Reviewed  COMPREHENSIVE METABOLIC PANEL - Abnormal; Notable for the following:       Result Value   Potassium 3.1 (*)    Calcium 8.6  (*)    AST 12 (*)    ALT 10 (*)    All other components within normal limits  CBC WITH DIFFERENTIAL/PLATELET - Abnormal; Notable for the following:    Hemoglobin 9.6 (*)    HCT 30.3 (*)    MCV 73.5 (*)    MCH 23.3 (*)    RDW 17.0 (*)    All other components within normal limits  RAPID URINE DRUG SCREEN, HOSP PERFORMED - Abnormal; Notable for the following:    Tetrahydrocannabinol POSITIVE (*)    All other components within normal limits  RAPID STREP SCREEN (NOT AT [facility])  CULTURE, GROUP A STREP ([facility])  ETHANOL  I-STAT BETA HCG BLOOD, ED ([facility], [facility], [facility] ONLY)    EKG  EKG Interpretation None       Radiology No results found.  Procedures Procedures (including critical care time)  Medications Ordered in ED Medications  NIFEdipine (PROCARDIA-XL/ADALAT CC) 24 hr tablet 60 mg (60 mg Oral Given 11/30/15 1714)  acetaminophen (TYLENOL) tablet 650 mg (not administered)  ibuprofen (ADVIL,MOTRIN) tablet 600 mg (600 mg Oral Given 11/30/15 1931)  ferrous sulfate tablet 325 mg (not administered)  hydroxychloroquine (PLAQUENIL) tablet 200 mg (200 mg Oral Given 11/30/15 1923)  Vitamin D (Ergocalciferol) (DRISDOL) capsule 50,000 Units (50,000 Units Oral Given 11/30/15 1923)  hydrOXYzine (ATARAX/VISTARIL) tablet 25 mg (not administered)  acetaminophen (TYLENOL) tablet 650 mg (650 mg Oral Given 11/30/15 1709)  potassium chloride SA (K-DUR,KLOR-CON) CR tablet 40 mEq (40 mEq Oral Given 11/30/15 1824)  ondansetron (ZOFRAN-ODT) disintegrating tablet 4 mg (4 mg Oral Given 11/30/15 2033)     Initial Impression / Assessment and Plan / ED Course  I have reviewed the triage vital signs and the nursing notes.  Pertinent labs & imaging results that were available during my care of the patient were reviewed by me and considered in my medical decision making (see chart for details).  Clinical Course as of Nov 30 2354  Tue Nov 30, 2015  1821 Anemia increased from previous values.  MCV low.  Most  likely iron deficiency.  Stable for outpatient follow up. Potassium low.  Will give dose of k.  Pt is medically clear.    [JK]  2355 Updated the patient on her status. I do not feel comfortable discharging her.  TTS consult recommends psychiatrist eval in the AM.   Will give dose of ativan so she can sleep.  [JK]  2356 IVC paperwork filled out but not filed in case she decides she wants to leave.  [JK]    Clinical Course User Index [JK] Linwood DibblesJon Ziad Maye, MD     Final Clinical Impressions(s) / ED Diagnoses   Final diagnoses:  Suicidal ideation  Hypokalemia  Anemia, unspecified type      Linwood DibblesJon [place] Festa, MD 11/30/15 [id number]    Linwood DibblesJon Niveah Boerner, MD 11/30/15 2356
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
## 2015-11-30 NOTE — ED Notes (Addendum)
Pt has in belonging bag:  2 black scarf, black shoes, iphone, black tights, grey shirt, black jacket, black bra, chap stick in locker #31

## 2015-11-30 NOTE — ED Notes (Signed)
Verbal order for Zofran 4mg  ODT for pt having reported vomiting.

## 2015-12-01 DIAGNOSIS — F4323 Adjustment disorder with mixed anxiety and depressed mood: Secondary | ICD-10-CM | POA: Diagnosis present

## 2015-12-01 DIAGNOSIS — Z880 Allergy status to penicillin: Secondary | ICD-10-CM

## 2015-12-01 DIAGNOSIS — Z91013 Allergy to seafood: Secondary | ICD-10-CM | POA: Diagnosis not present

## 2015-12-01 DIAGNOSIS — Z8249 Family history of ischemic heart disease and other diseases of the circulatory system: Secondary | ICD-10-CM

## 2015-12-01 DIAGNOSIS — Z79899 Other long term (current) drug therapy: Secondary | ICD-10-CM

## 2015-12-01 MED ORDER — HYDROXYZINE HCL 25 MG PO TABS: 25.0000 mg | ORAL_TABLET | Freq: Every day | ORAL | Status: DC | PRN

## 2015-12-01 MED ORDER — HYDROXYZINE HCL 25 MG PO TABS: 25.0000 mg | ORAL_TABLET | Freq: Every day | ORAL | 0 refills | Status: AC | PRN

## 2015-12-01 MED ORDER — LORAZEPAM 1 MG PO TABS
1.0000 mg | ORAL_TABLET | Freq: Once | ORAL | Status: AC
  Administered 2015-12-01: 1 mg via ORAL
  Filled 2015-12-01: qty 1
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
## 2015-12-01 NOTE — ED Notes (Signed)
ED Charge nurse Ilene and myself at registration to speak with mother of pt.  Mother adamantly states she wants pt to be discharged in her care.  Mother stated we were holding pt against her will.  Mother stated she would speak with administration in am.  Dr Linwood DibblesJon Knapp and Dr Devoria AlbeIva Knapp notified.  Mother informed pt would need to be IVCed if she would not voluntarily stay for treatment.

## 2015-12-01 NOTE — Discharge Instructions (Signed)
[facility] or a provider of your choice.

## 2015-12-01 NOTE — ED Notes (Signed)
Pt discharged home. Discharged instructions read to pt who verbalized understanding. All belongings returned to pt who signed for same. Denies SI/HI, is not delusional and not responding to internal stimuli. Escorted pt to the ED exit.    
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
## 2015-12-01 NOTE — ED Notes (Addendum)
Dr. Linwood DibblesJon Knapp spoke to patient. Patient agreeing to stay in hospital voluntarily at this time. Orders received for anxiety. Patient given ativan as prescribed. Will continue to monitor.

## 2015-12-02 NOTE — [facility] Suicide Risk Assessment (Signed)
Suicide Risk Assessment  Discharge Assessment   [facility]   Principal Problem: Adjustment disorder with mixed anxiety and depressed mood Discharge Diagnoses:  Patient Active Problem List   Diagnosis Date Noted  . Adjustment disorder with mixed anxiety and depressed mood [F43.23] 12/01/2015    Priority: High    Total Time spent with patient: 45 minutes  Musculoskeletal: Strength & Muscle Tone: within normal limits Gait & Station: normal Patient leans: N/A  Psychiatric Specialty Exam: Physical Exam  Constitutional: She is oriented to person, place, and time. She appears well-developed and well-nourished.  HENT:  Head: Normocephalic.  Neck: Normal range of motion.  Respiratory: Effort normal.  Musculoskeletal: Normal range of motion.  Neurological: She is alert and oriented to person, place, and time.  Skin: Skin is warm and dry.  Psychiatric: Her speech is normal and behavior is normal. Judgment and thought content normal. Cognition and memory are normal. She exhibits a depressed mood.    Review of Systems  Constitutional: Negative.   HENT: Negative.   Eyes: Negative.   Respiratory: Negative.   Cardiovascular: Negative.   Gastrointestinal: Negative.   Genitourinary: Negative.   Musculoskeletal: Negative.   Skin: Negative.   Neurological: Negative.   Endo/Heme/Allergies: Negative.   Psychiatric/Behavioral: Positive for depression.    Blood pressure 142/84, pulse 85, temperature 98.4 F (36.9 C), temperature source Oral, resp. rate 18, last menstrual period 11/28/2015, SpO2 100 %.There is no height or weight on file to calculate BMI.  General Appearance: Casual  Eye Contact:  Good  Speech:  Normal Rate  Volume:  Normal  Mood:  Depressed  Affect:  Congruent  Thought Process:  Coherent and Descriptions of Associations: Intact  Orientation:  Full (Time, Place, and Person)  Thought Content:  WDL  Suicidal Thoughts:  No  Homicidal Thoughts:   No  Memory:  Immediate;   Good Recent;   Good Remote;   Good  Judgement:  Good  Insight:  Good  Psychomotor Activity:  Normal  Concentration:  Concentration: Good and Attention Span: Good  Recall:  Good  Fund of Knowledge:  Good  Language:  Good  Akathisia:  No  Handed:  Right  AIMS (if indicated):     Assets:  Housing Leisure Time Physical Health Resilience Social Support Vocational/Educational  ADL's:  Intact  Cognition:  WNL  Sleep:       Mental Status Per Nursing Assessment::   On Admission:   depression with suicidal ideations  Demographic Factors:  Adolescent or young adult  Loss Factors: NA  Historical Factors: NA  Risk Reduction Factors:   Sense of responsibility to family, Living with another person, especially a relative and Positive social support  Continued Clinical Symptoms:  Depression, mild  Cognitive Features That Contribute To Risk:  None    Suicide Risk:  Minimal: No identifiable suicidal ideation.  Patients presenting with no risk factors but with morbid ruminations; may be classified as minimal risk based on the severity of the depressive symptoms    Plan Of Care/Follow-up recommendations:  Activity:  as tolerated Diet:  hearth healthy diet  Budd Freiermuth, NP 12/02/2015, 7:17 AM

## 2015-12-03 LAB — CULTURE, GROUP A STREP (THRC)

## 2016-06-08 ENCOUNTER — Other Ambulatory Visit: Payer: Self-pay | Admitting: Physician Assistant

## 2016-06-08 ENCOUNTER — Other Ambulatory Visit: Payer: Self-pay | Admitting: Nurse Practitioner

## 2016-06-08 DIAGNOSIS — N739 Female pelvic inflammatory disease, unspecified: Secondary | ICD-10-CM

## 2016-06-16 ENCOUNTER — Other Ambulatory Visit: Payer: Self-pay

## 2016-07-03 ENCOUNTER — Ambulatory Visit
Admission: RE | Admit: 2016-07-03 | Discharge: 2016-07-03 | Disposition: A | Discharge status: Home / Self Care | Payer: Medicaid Other | Source: Ambulatory Visit | Attending: Nurse Practitioner | Admitting: Nurse Practitioner

## 2016-07-03 DIAGNOSIS — N739 Female pelvic inflammatory disease, unspecified: Secondary | ICD-10-CM

## 2017-09-08 IMAGING — CT CT ABD-PELV W/ CM
2 of 4 series · 16 of 46 positions shown, 18 images · IV contrast (100 ML OMNI 300)
Comparison: Pelvic ultrasound performed 11/28/2014

CLINICAL DATA: Acute onset of left lower quadrant abdominal pain
and left leg numbness. Current history of arteriovenous malformation
within the abdomen. Initial encounter.

EXAM:
CT ABDOMEN AND PELVIS WITH CONTRAST
TECHNIQUE: Multidetector CT imaging of the abdomen and pelvis was performed
using the standard protocol following bolus administration of
intravenous contrast.
CONTRAST:  100mL OMNIPAQUE IOHEXOL 300 MG/ML  SOLN

[Series 2: abd/pel with · axial · 0.66mm/px · z∈[+1106,+1506]mm · 13 of 88 slices shown, 15 images]
[im 4/88  soft-tissue]
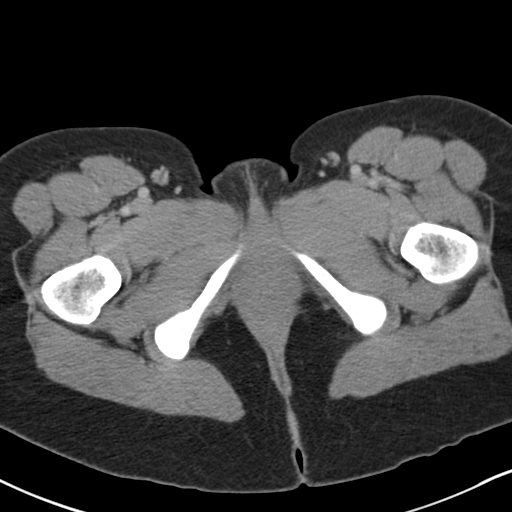
[im 4/88  bone]
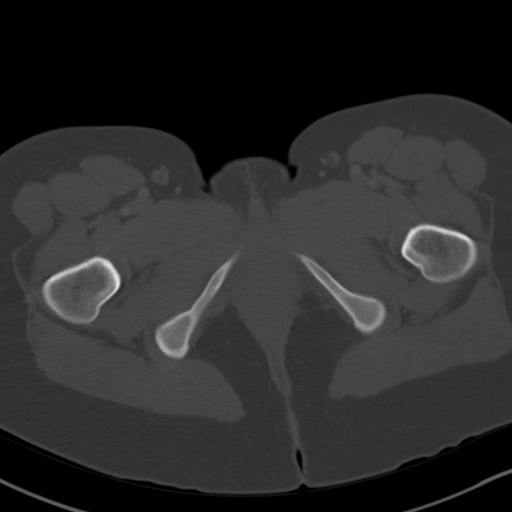
[im 11/88  soft-tissue]
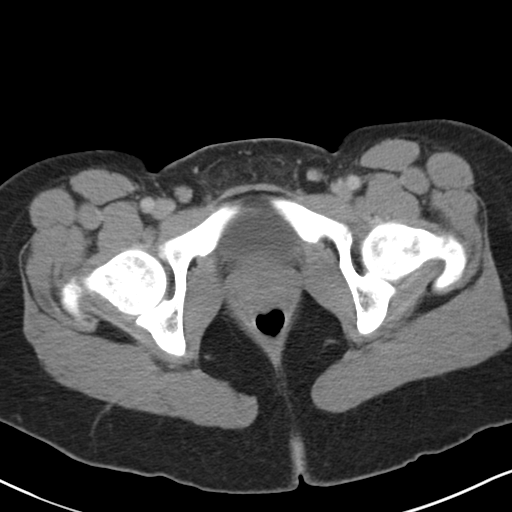
[im 17/88  soft-tissue]
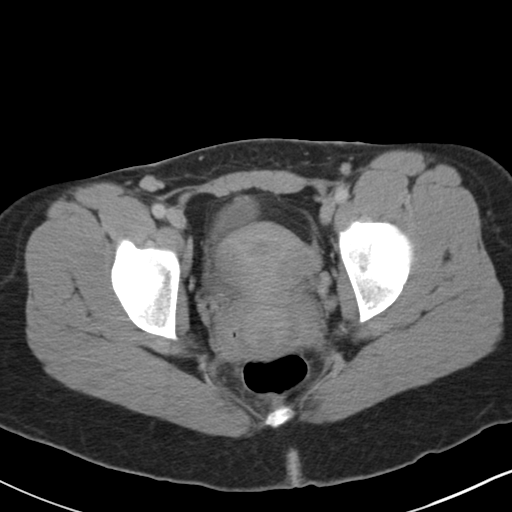
[im 24/88  soft-tissue]
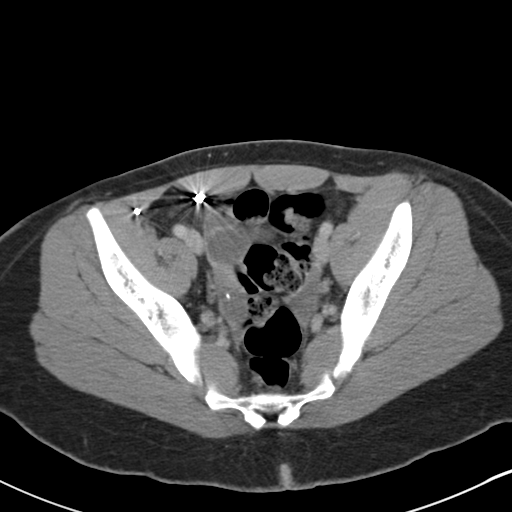
[im 31/88  soft-tissue]
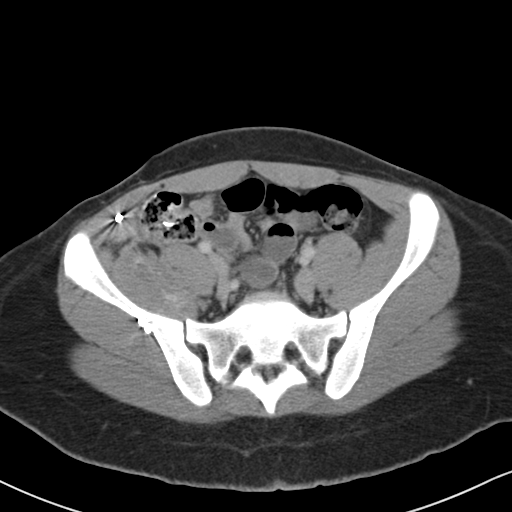
[im 37/88  soft-tissue]
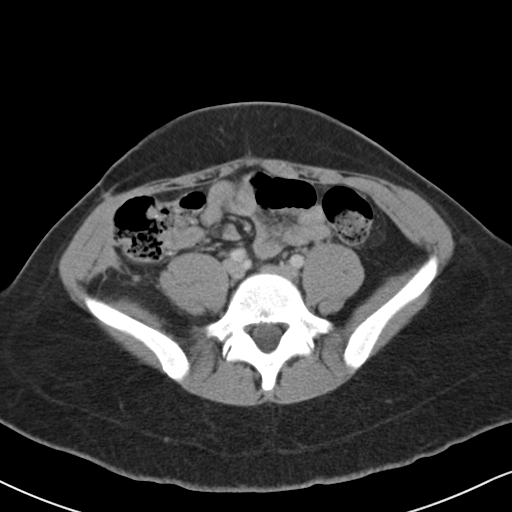
[im 44/88  soft-tissue]
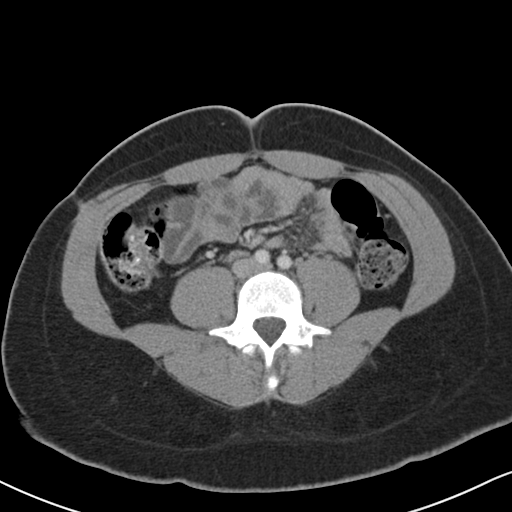
[im 51/88  soft-tissue]
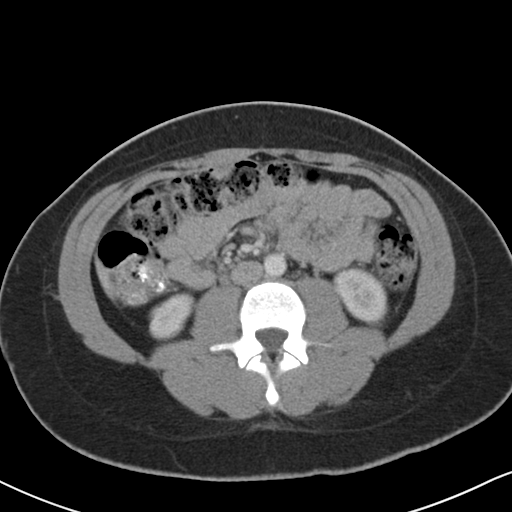
[im 57/88  soft-tissue]
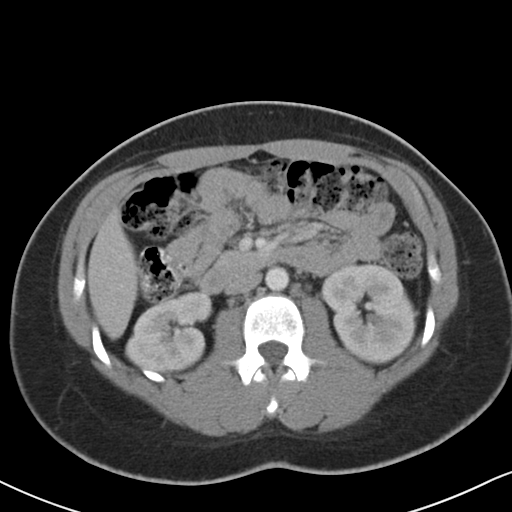
[im 57/88  bone]
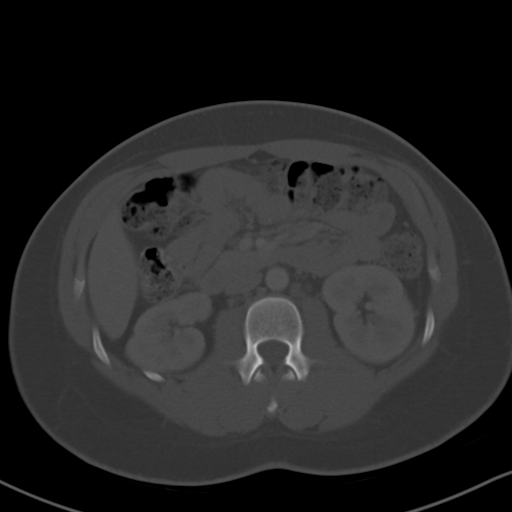
[im 64/88  soft-tissue]
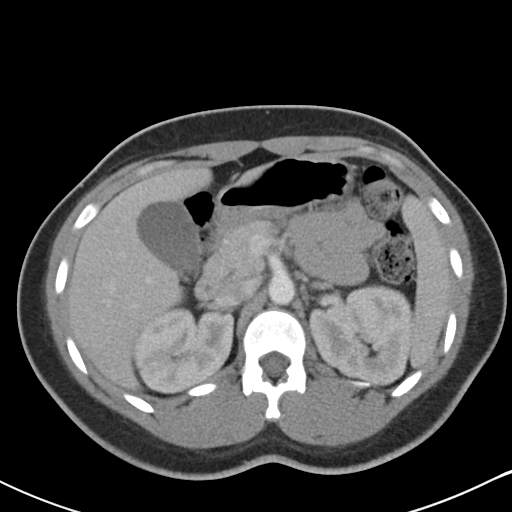
[im 71/88  soft-tissue]
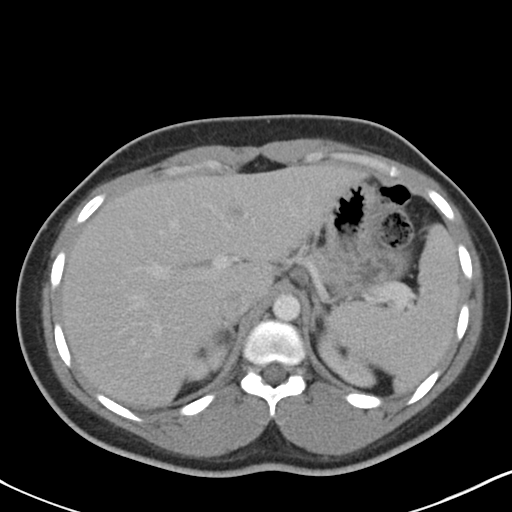
[im 77/88  soft-tissue]
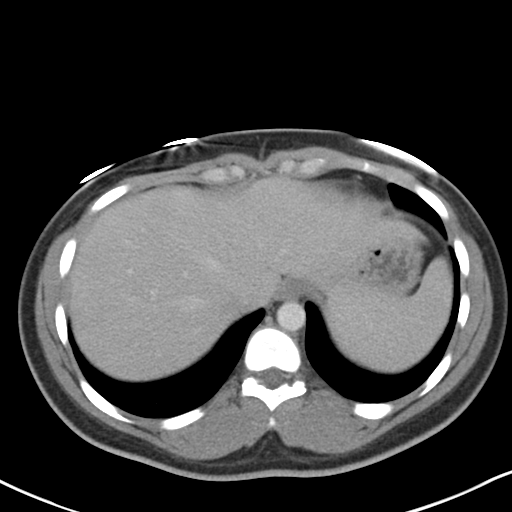
[im 84/88  soft-tissue]
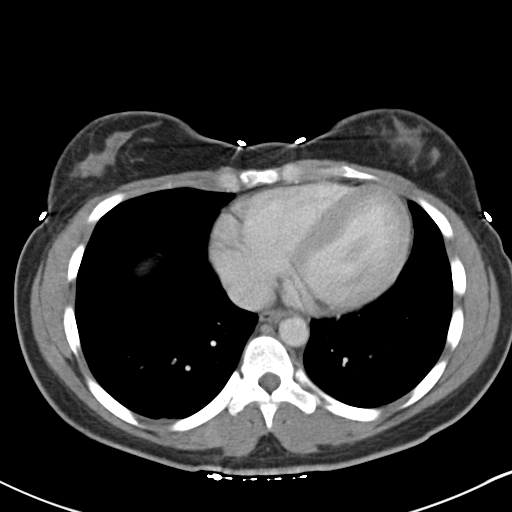

[Series 4: coronal a/|p · coronal · 0.66mm/px · 3 of 75 slices shown]
[im 25/75  soft-tissue]
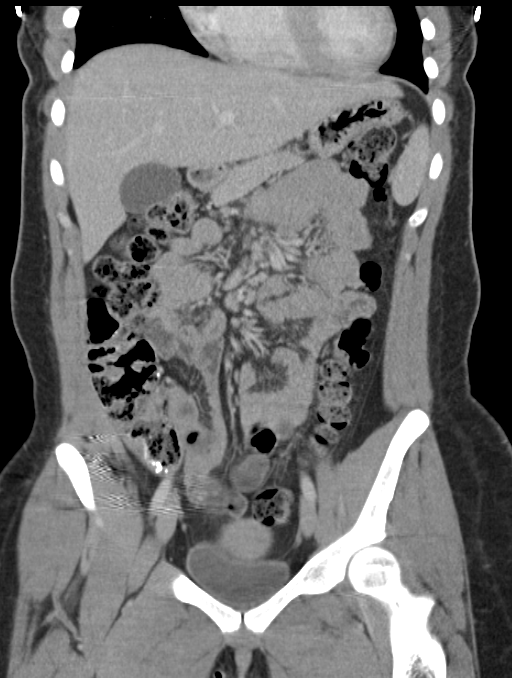
[im 33/75  soft-tissue]
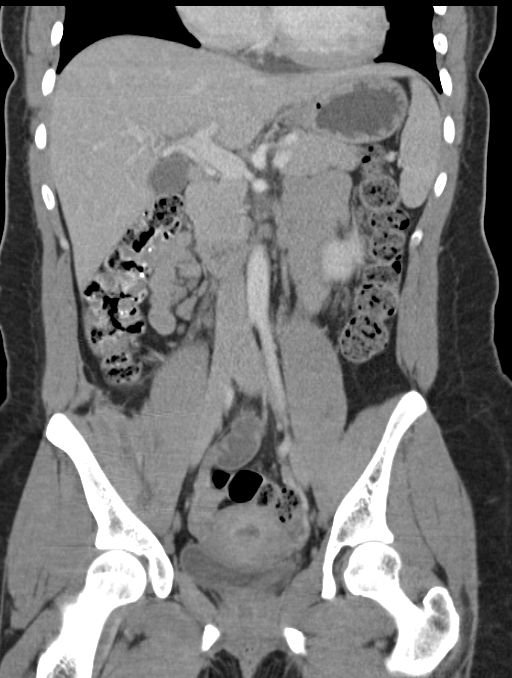
[im 42/75  soft-tissue]
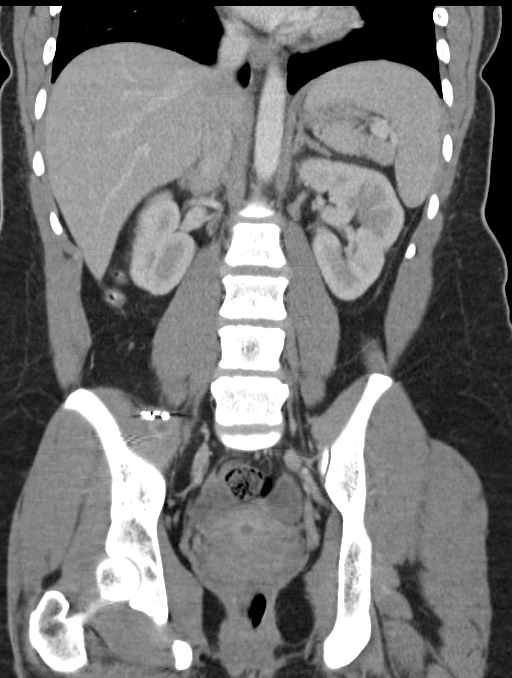

[16 of 46 positions shown; findings below may reference images not displayed]

FINDINGS: The visualized lung bases are clear.

The liver and spleen are unremarkable in appearance. The gallbladder
is within normal limits. The pancreas and adrenal glands are
unremarkable.

A 1.5 cm cyst is noted at the upper pole of the right kidney. There
is no evidence of hydronephrosis. No renal or ureteral stones are
seen. No perinephric stranding is appreciated.

No free fluid is identified. The small bowel is unremarkable in
appearance. The stomach is within normal limits. No acute vascular
abnormalities are seen.

The appendix is not definitely characterized; there is no evidence
of appendicitis. The colon is partially filled with stool and is
unremarkable in appearance.

The bladder is mildly distended and grossly unremarkable. The uterus
is within normal limits. The ovaries are relatively symmetric. No
suspicious adnexal masses are seen. No inguinal lymphadenopathy is
seen.

Postoperative change is noted along the abdominal wall at the right
lower quadrant.

No acute osseous abnormalities are identified.
IMPRESSION: 1. No acute abnormality seen within the abdomen or pelvis.
2. Small right renal cyst noted.

## 2018-11-07 IMAGING — US US PELVIS COMPLETE
1 series · 13 of 25 positions shown · non-contrast
Comparison: abdominal and pelvic CT scan March 29, 2015 and
pelvic ultrasound November 28, 2014.

CLINICAL DATA: Two months of pelvic pain



[Series 1: us pelvis complete · 0.16mm/px · 13 of 71 slices shown]
[im 1/71]
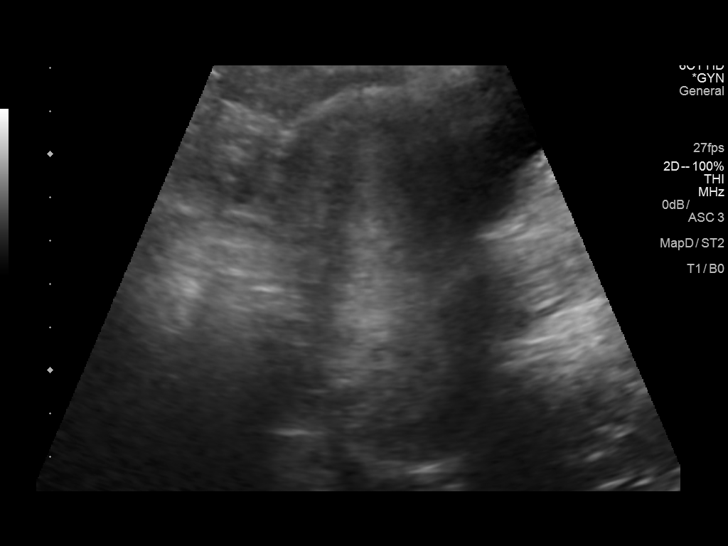
[im 6/71]
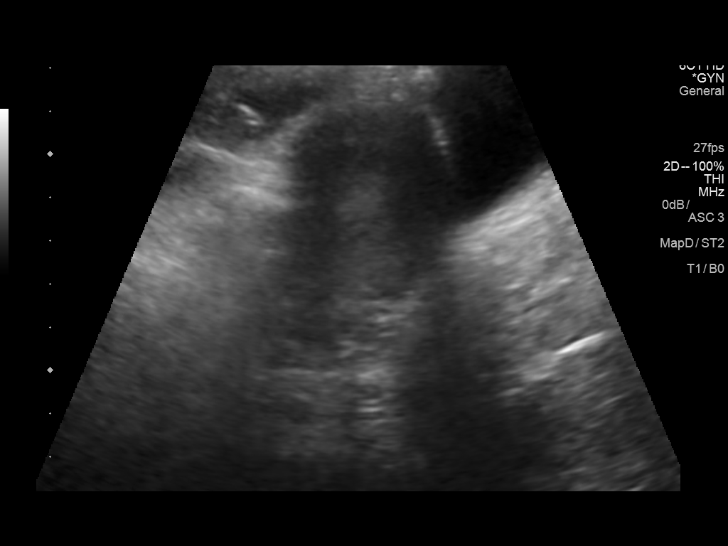
[im 12/71]
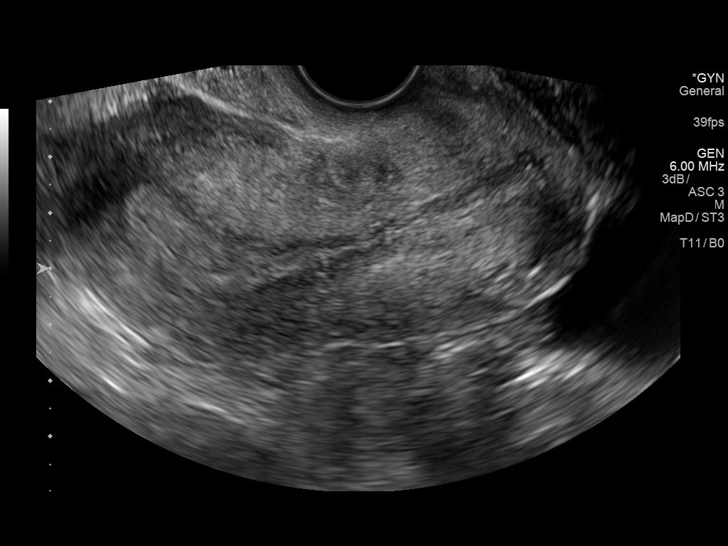
[im 18/71]
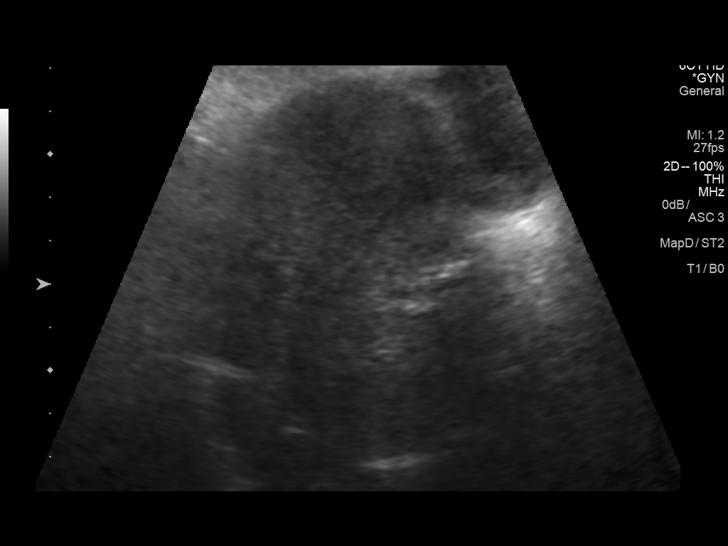
[im 24/71]
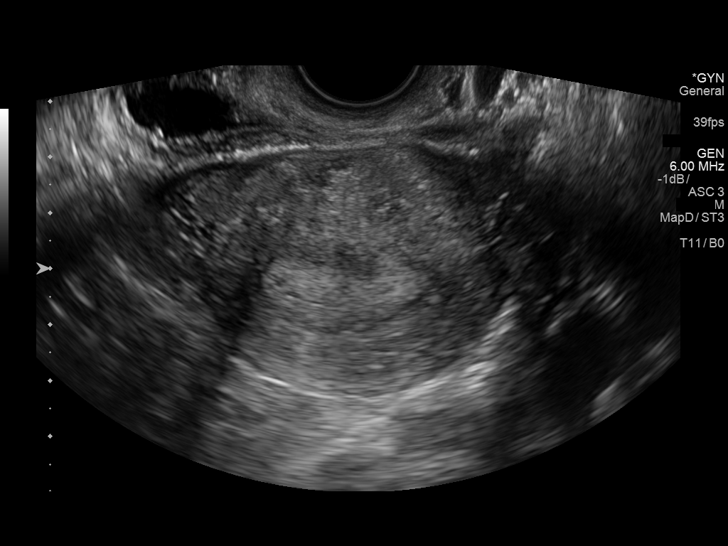
[im 30/71]
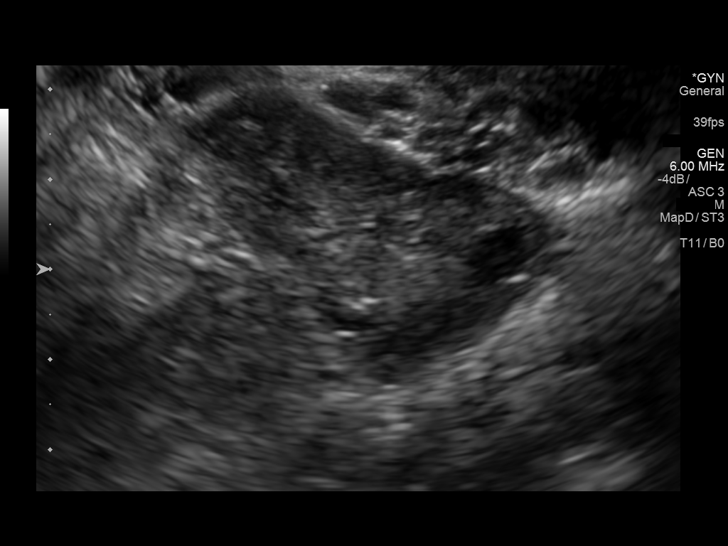
[im 36/71]
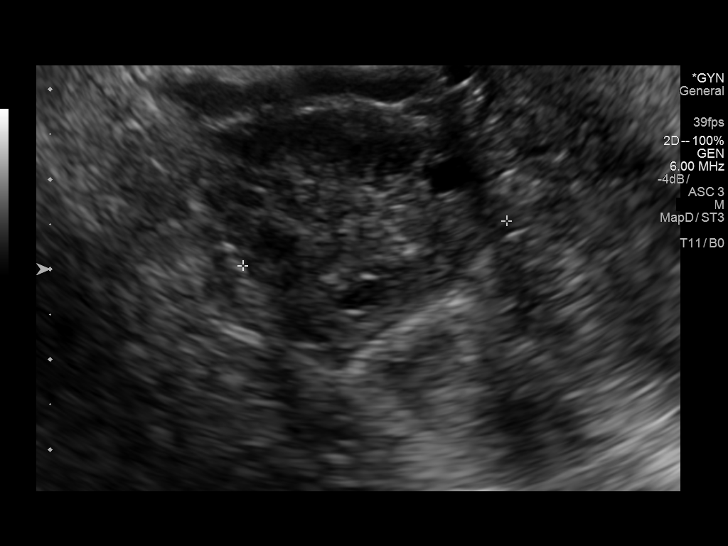
[im 41/71]
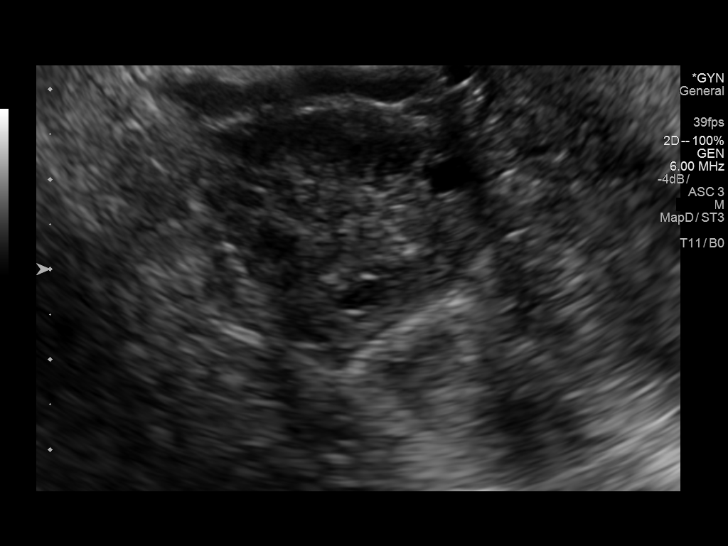
[im 47/71]
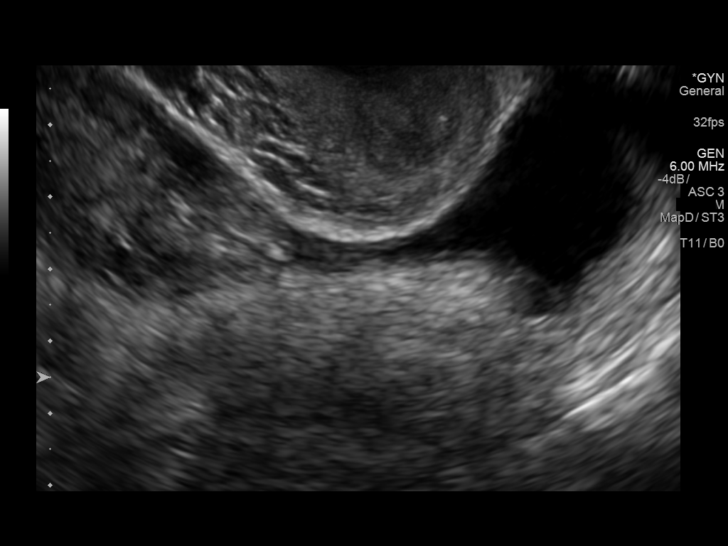
[im 53/71]
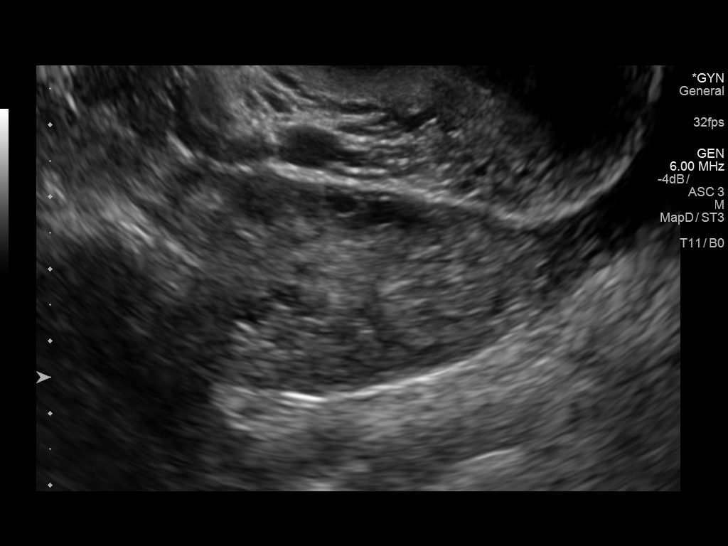
[im 59/71]
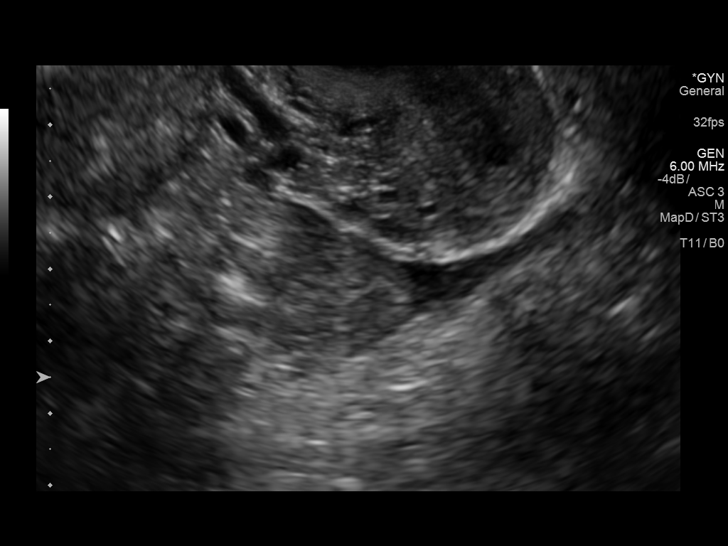
[im 65/71]
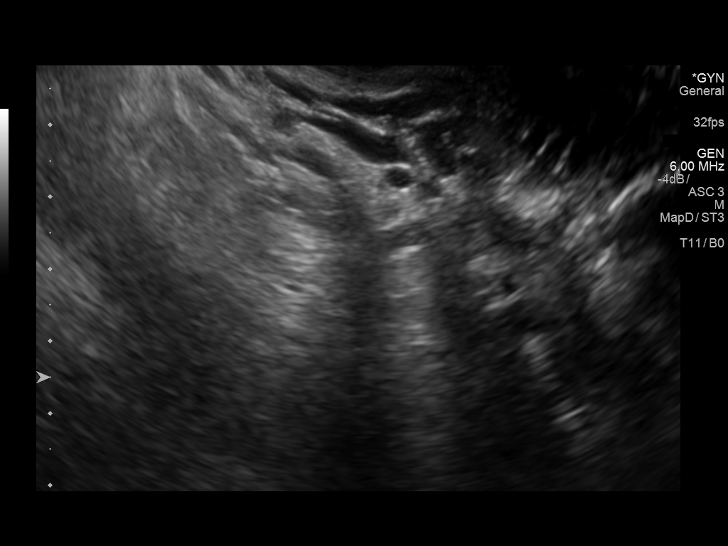
[im 71/71]
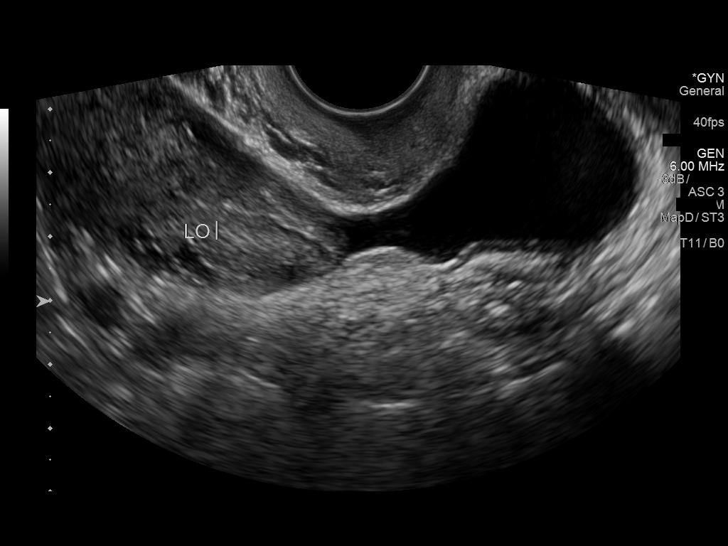

[13 of 25 positions shown; findings below may reference images not displayed]

FINDINGS: Uterus

Measurements: 9.2 x 4.4 x 5.6 cm. No fibroids or other mass
visualized.

Endometrium

Thickness: 10 mm.  No focal abnormality visualized.

Right ovary

Measurements: 4.4 x 2.1 x 3.0 cm. Normal appearance/no adnexal mass.

Left ovary

Measurements: 5.4 x 2.8 x 2.0 cm. Normal appearance/no adnexal mass.
The left fallopian tube is dilated.

Other findings

There is free fluid in the cul de sac.
IMPRESSION: Hydrosalpinx on the left. Significant debris within it is not
observed. No discrete left adnexal abscess is demonstrated. The left
ovary is normal in appearance.

Normal appearance of the uterus, endometrium, and right ovary.

There is a moderate amount of free fluid in the cul de sac.
